# Patient Record
Sex: Female | Born: 1975 | Race: Black or African American | Hispanic: No | Marital: Single | Smoking: Never smoker
Health system: Southern US, Community
[De-identification: ages and names within clinical notes are randomized; demographics above are authoritative.]

## PROBLEM LIST (undated history)

## (undated) HISTORY — PX: CHEST TUBE INSERTION: SHX231

---

## 1999-02-07 ENCOUNTER — Encounter: Payer: Self-pay | Admitting: Emergency Medicine

## 1999-02-07 ENCOUNTER — Emergency Department (HOSPITAL_COMMUNITY): Admission: EM | Admit: 1999-02-07 | Discharge: 1999-02-07 | Payer: Self-pay | Admitting: Emergency Medicine

## 1999-03-27 ENCOUNTER — Emergency Department (HOSPITAL_COMMUNITY): Admission: EM | Admit: 1999-03-27 | Discharge: 1999-03-27 | Payer: Self-pay | Admitting: Emergency Medicine

## 2001-05-25 ENCOUNTER — Ambulatory Visit (HOSPITAL_COMMUNITY): Admission: RE | Admit: 2001-05-25 | Discharge: 2001-05-25 | Payer: Self-pay | Admitting: Internal Medicine

## 2001-06-08 ENCOUNTER — Encounter: Payer: Self-pay | Admitting: Internal Medicine

## 2001-06-08 ENCOUNTER — Ambulatory Visit (HOSPITAL_COMMUNITY): Admission: RE | Admit: 2001-06-08 | Discharge: 2001-06-08 | Payer: Self-pay | Admitting: Internal Medicine

## 2002-02-03 ENCOUNTER — Emergency Department (HOSPITAL_COMMUNITY): Admission: EM | Admit: 2002-02-03 | Discharge: 2002-02-04 | Payer: Self-pay | Admitting: Emergency Medicine

## 2002-05-14 ENCOUNTER — Encounter: Payer: Self-pay | Admitting: Otolaryngology

## 2002-05-14 ENCOUNTER — Encounter: Admission: RE | Admit: 2002-05-14 | Discharge: 2002-05-14 | Payer: Self-pay | Admitting: Otolaryngology

## 2002-06-15 ENCOUNTER — Other Ambulatory Visit: Admission: RE | Admit: 2002-06-15 | Discharge: 2002-06-15 | Payer: Self-pay | Admitting: Otolaryngology

## 2002-07-29 ENCOUNTER — Encounter: Payer: Self-pay | Admitting: Otolaryngology

## 2002-07-29 ENCOUNTER — Encounter: Admission: RE | Admit: 2002-07-29 | Discharge: 2002-07-29 | Payer: Self-pay | Admitting: Otolaryngology

## 2003-03-26 ENCOUNTER — Emergency Department (HOSPITAL_COMMUNITY): Admission: EM | Admit: 2003-03-26 | Discharge: 2003-03-26 | Payer: Self-pay | Admitting: Emergency Medicine

## 2004-03-19 ENCOUNTER — Emergency Department (HOSPITAL_COMMUNITY): Admission: EM | Admit: 2004-03-19 | Discharge: 2004-03-19 | Payer: Self-pay | Admitting: Emergency Medicine

## 2006-12-31 ENCOUNTER — Inpatient Hospital Stay (HOSPITAL_COMMUNITY): Admission: AD | Admit: 2006-12-31 | Discharge: 2006-12-31 | Payer: Self-pay | Admitting: Family Medicine

## 2007-01-02 ENCOUNTER — Inpatient Hospital Stay (HOSPITAL_COMMUNITY): Admission: AD | Admit: 2007-01-02 | Discharge: 2007-01-02 | Payer: Self-pay | Admitting: Obstetrics and Gynecology

## 2007-06-12 ENCOUNTER — Ambulatory Visit (HOSPITAL_COMMUNITY): Admission: RE | Admit: 2007-06-12 | Discharge: 2007-06-12 | Payer: Self-pay | Admitting: Obstetrics and Gynecology

## 2007-06-18 ENCOUNTER — Ambulatory Visit (HOSPITAL_COMMUNITY): Admission: RE | Admit: 2007-06-18 | Discharge: 2007-06-18 | Payer: Self-pay | Admitting: Obstetrics and Gynecology

## 2007-06-23 ENCOUNTER — Encounter (INDEPENDENT_AMBULATORY_CARE_PROVIDER_SITE_OTHER): Payer: Self-pay | Admitting: Obstetrics and Gynecology

## 2007-06-23 ENCOUNTER — Inpatient Hospital Stay (HOSPITAL_COMMUNITY): Admission: AD | Admit: 2007-06-23 | Discharge: 2007-06-25 | Payer: Self-pay | Admitting: Obstetrics and Gynecology

## 2008-04-05 ENCOUNTER — Emergency Department (HOSPITAL_COMMUNITY): Admission: EM | Admit: 2008-04-05 | Discharge: 2008-04-05 | Payer: Self-pay | Admitting: Family Medicine

## 2009-04-14 ENCOUNTER — Emergency Department (HOSPITAL_COMMUNITY): Admission: EM | Admit: 2009-04-14 | Discharge: 2009-04-14 | Payer: Self-pay | Admitting: Emergency Medicine

## 2009-10-15 ENCOUNTER — Emergency Department (HOSPITAL_COMMUNITY): Admission: EM | Admit: 2009-10-15 | Discharge: 2009-10-15 | Payer: Self-pay | Admitting: Emergency Medicine

## 2011-01-24 ENCOUNTER — Emergency Department (HOSPITAL_COMMUNITY): Payer: No Typology Code available for payment source

## 2011-01-24 ENCOUNTER — Emergency Department (HOSPITAL_COMMUNITY)
Admission: EM | Admit: 2011-01-24 | Discharge: 2011-01-24 | Disposition: A | Discharge status: Home / Self Care | Payer: No Typology Code available for payment source | Attending: Emergency Medicine | Admitting: Emergency Medicine

## 2011-01-24 DIAGNOSIS — Y929 Unspecified place or not applicable: Secondary | ICD-10-CM | POA: Insufficient documentation

## 2011-01-24 DIAGNOSIS — M79609 Pain in unspecified limb: Secondary | ICD-10-CM | POA: Insufficient documentation

## 2011-01-24 DIAGNOSIS — M545 Low back pain, unspecified: Secondary | ICD-10-CM | POA: Insufficient documentation

## 2011-01-24 DIAGNOSIS — IMO0001 Reserved for inherently not codable concepts without codable children: Secondary | ICD-10-CM | POA: Insufficient documentation

## 2011-01-24 DIAGNOSIS — M2550 Pain in unspecified joint: Secondary | ICD-10-CM | POA: Insufficient documentation

## 2011-01-24 DIAGNOSIS — S335XXA Sprain of ligaments of lumbar spine, initial encounter: Secondary | ICD-10-CM | POA: Insufficient documentation

## 2011-03-28 LAB — CBC
MCHC: 33.3 g/dL (ref 30.0–36.0)
RBC: 4.59 MIL/uL (ref 3.87–5.11)
RDW: 13.5 % (ref 11.5–15.5)

## 2011-03-28 LAB — POCT I-STAT, CHEM 8
Creatinine, Ser: 0.7 mg/dL (ref 0.4–1.2)
Glucose, Bld: 98 mg/dL (ref 70–99)
Hemoglobin: 14.3 g/dL (ref 12.0–15.0)
TCO2: 25 mmol/L (ref 0–100)

## 2011-03-28 LAB — URINALYSIS, ROUTINE W REFLEX MICROSCOPIC
Nitrite: NEGATIVE
Specific Gravity, Urine: 1.024 (ref 1.005–1.030)
Urobilinogen, UA: 0.2 mg/dL (ref 0.0–1.0)

## 2011-03-28 LAB — URINE MICROSCOPIC-ADD ON

## 2011-03-28 LAB — DIFFERENTIAL
Basophils Absolute: 0 10*3/uL (ref 0.0–0.1)
Basophils Relative: 1 % (ref 0–1)
Monocytes Relative: 15 % — ABNORMAL HIGH (ref 3–12)
Neutro Abs: 1.6 10*3/uL — ABNORMAL LOW (ref 1.7–7.7)
Neutrophils Relative %: 43 % (ref 43–77)

## 2011-05-06 ENCOUNTER — Inpatient Hospital Stay (HOSPITAL_COMMUNITY)
Admission: EM | Admit: 2011-05-06 | Discharge: 2011-05-10 | DRG: 201 | Disposition: A | Discharge status: Home / Self Care | Payer: Medicaid Other | Attending: Thoracic Surgery (Cardiothoracic Vascular Surgery) | Admitting: Thoracic Surgery (Cardiothoracic Vascular Surgery)

## 2011-05-06 ENCOUNTER — Emergency Department (HOSPITAL_COMMUNITY): Payer: Medicaid Other

## 2011-05-06 DIAGNOSIS — J930 Spontaneous tension pneumothorax: Secondary | ICD-10-CM

## 2011-05-06 DIAGNOSIS — J9383 Other pneumothorax: Principal | ICD-10-CM | POA: Diagnosis present

## 2011-05-07 ENCOUNTER — Inpatient Hospital Stay (HOSPITAL_COMMUNITY): Payer: Medicaid Other
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
## 2011-05-07 NOTE — Op Note (Signed)
  NAMEMarland Armstrong  AVIYANA, SONNTAG               ACCOUNT NO.:  [id number]  MEDICAL RECORD NO.:  [id number]           PATIENT TYPE:  I  LOCATION:  2041                         FACILITY:  [facility]  PHYSICIAN:  Salvatore Decent. Cornelius Moras, M.D. DATE OF BIRTH:  January 15, 1976  DATE OF PROCEDURE:  05/06/2011 DATE OF DISCHARGE:                              OPERATIVE REPORT   PREOPERATIVE DIAGNOSIS:  Right spontaneous pneumothorax.  POSTOPERATIVE DIAGNOSIS:  Right spontaneous pneumothorax.  PROCEDURE:  Right chest tube placement.  SURGEON:  Salvatore Decent. Cornelius Moras, MD  ANESTHESIA:  Lidocaine 1% local with intravenous sedation.  PROCEDURE NOTE IN DETAIL:  Following full informed consent with the patient on her stretcher in the emergency department, light intravenous sedation is administered including a total of 2 mg of morphine sulfate and 2 mg of midazolam.  Continuous pulse oximetry and frequent vital signs were monitored with the patient on telemetry in the emergency department.  The patient's right anterior chest was prepared with Betadine solution and draped in a sterile manner.  Lidocaine 1% solution was utilized to anesthetize skin, subcutaneous tissues, and intercostal space overlying the anterior axillary line and approximately the fifth intercostal space.  A small incision is made and further 1% lidocaine was administered directly into the intercostal space.  A 28-French straight chest tube was placed through the incision into the right pleural space and secured to the skin with silk suture.  The procedure was tolerated well.  Repeat chest x-ray pending.     Salvatore Decent. Cornelius Moras, M.D.     CHO/MEDQ  D:  05/06/2011  T:  05/07/2011  Job:  [id number]  Electronically Signed by Tressie Stalker M.D. on 05/07/2011 07:39:13 AM

## 2011-05-07 NOTE — Op Note (Signed)
NAMEMarland Kitchen  Erica, Armstrong               ACCOUNT NO.:  [id number]   MEDICAL RECORD NO.:  [id number]          PATIENT TYPE:  INP   LOCATION:  9114                          FACILITY:  [facility]   PHYSICIAN:  Duke Salvia. Marcelle Overlie, M.D.DATE OF BIRTH:  1976/11/23   DATE OF PROCEDURE:  06/23/2007  DATE OF DISCHARGE:                               OPERATIVE REPORT   DELIVERY NOTE:  This patient was pushing well with a stable FHR with  epidural anesthesia on board.  She was noted to have some significant  bradycardia after each push, the head was crowning straight OA. At that  point, the decision was made to proceed with the VE assisted delivery.  The Kiwi was applied, one traction effort coordinated with maternal  pushing to effect delivery of a female, Apgars 8/9, DeLee plus bulb  suction was carried out, the NICU team was in attendance.  There was no  nuchal cord.  The placenta was delivered spontaneously intact and sent  to pathology.  Pitocin was given IV at that point.   Estimated blood loss was 350.  She had a second degree perineal  laceration, a left periurethral laceration, and a small superficial  right periurethral laceration, all repaired in layered fashion with 3-0  Vicryl Rapide suture.  A pH was sent and is pending.  Mother and baby  doing well at that point.  The baby went to regular nursery for further  observation.      Richard M. Marcelle Overlie, M.D.  Electronically Signed     RMH/MEDQ  D:  06/23/2007  T:  06/23/2007  Job:  [id number]

## 2011-05-08 ENCOUNTER — Inpatient Hospital Stay (HOSPITAL_COMMUNITY): Payer: Medicaid Other

## 2011-05-08 DIAGNOSIS — J930 Spontaneous tension pneumothorax: Secondary | ICD-10-CM

## 2011-05-09 ENCOUNTER — Inpatient Hospital Stay (HOSPITAL_COMMUNITY): Payer: Medicaid Other

## 2011-05-10 ENCOUNTER — Inpatient Hospital Stay (HOSPITAL_COMMUNITY): Payer: Medicaid Other

## 2011-05-10 NOTE — Discharge Summary (Signed)
[facility]  Patient:    Erica Armstrong, Erica Armstrong                        MRN: [id number] Proc. Date: 05/25/01 Adm. Date:  [id number] Attending:  Nathen May CC:         Electrophysiology Laboratory   Referring Physician Discharge [place]  PREOPERATIVE DIAGNOSIS:  Syncope.  POSTOPERATIVE DIAGNOSIS:  Syncope.  PROCEDURE:  Head-up tilt table testing.  DESCRIPTION OF PROCEDURE:  The patient was submitted for tilt table testing. She was equilibrated in the supine position with blood pressures of 100 approximately and heart rates of 60.  She was then tilted upright.  There was a significant increase in her resting heart rate to the mid-80s.  After 18-19 minutes, there was a precipitous decrease in her heart rate from the 80s to the 50s with blood pressures going from 100 to 74, with typical premonitory symptoms and presyncope.  She was returned to the supine position with resolution of her symptoms.  IMPRESSION: 1. Orthostatic hypotension. 2. Neurocardiogenic syncope.  RECOMMENDATIONS: 1. Florinef 100 mcg b.i.d. 2. Increase salt and fluid intake. 3. Follow up with Dr. _____ next week for blood pressure check and    electrolytes. DD:  05/25/01 TD:  05/25/01 Job: [id number]
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
## 2011-05-17 ENCOUNTER — Encounter (INDEPENDENT_AMBULATORY_CARE_PROVIDER_SITE_OTHER): Payer: Medicaid Other

## 2011-05-17 DIAGNOSIS — J930 Spontaneous tension pneumothorax: Secondary | ICD-10-CM

## 2011-05-24 ENCOUNTER — Other Ambulatory Visit: Payer: Self-pay | Admitting: Thoracic Surgery (Cardiothoracic Vascular Surgery)

## 2011-05-24 DIAGNOSIS — J930 Spontaneous tension pneumothorax: Secondary | ICD-10-CM

## 2011-05-27 ENCOUNTER — Ambulatory Visit
Admission: RE | Admit: 2011-05-27 | Discharge: 2011-05-27 | Disposition: A | Discharge status: Home / Self Care | Payer: Medicaid Other | Source: Ambulatory Visit | Attending: Thoracic Surgery (Cardiothoracic Vascular Surgery) | Admitting: Thoracic Surgery (Cardiothoracic Vascular Surgery)

## 2011-05-27 ENCOUNTER — Encounter (INDEPENDENT_AMBULATORY_CARE_PROVIDER_SITE_OTHER): Payer: Medicaid Other | Admitting: Thoracic Surgery (Cardiothoracic Vascular Surgery)

## 2011-05-27 DIAGNOSIS — J930 Spontaneous tension pneumothorax: Secondary | ICD-10-CM

## 2011-05-28 NOTE — Assessment & Plan Note (Signed)
OFFICE VISIT  Erica Armstrong, Erica Armstrong DOB:  10/22/1976                                        May 27, 2011 CHART #:  [id number]  HISTORY OF PRESENT ILLNESS:  The patient returns for routine followup, status post right spontaneous pneumothorax for which she underwent chest tube placement on May 06, 2011.  Her lung re-expanded without complication and her chest tube was removed uneventfully.  Since hospital discharge she has done fine.  She reports only minimal residual soreness in the right side and she has had no shortness of breath.  REVIEW OF SYSTEMS:  The remainder of her review of systems is unremarkable.  PHYSICAL EXAMINATION:  GENERAL:  Well-appearing female.  VITAL SIGNS: Blood pressure 121/69, pulse 72, and oxygen saturation 99% on room air. LUNGS:  Auscultation reveals clear breath sounds that are symmetrical bilaterally.  The old chest tube incision has healed nicely.  DIAGNOSTICS:  Test chest x-ray demonstrates no residual pneumothorax. No other abnormalities are noted.  IMPRESSION:  Resolved right spontaneous pneumothorax.  PLAN:  In the future, the patient will call or return to see [place] as needed.  She has been counseled regarding the 80% likelihood this will never again recur, but she also understands that in the 20% possibility that she does have a recurrent spontaneous pneumothorax on the right side, we would need to consider surgical intervention.  All their questions have been addressed.  At this point I think she has no physical limitations.  Salvatore Decent. Erica Armstrong, M.D. Electronically Signed  CHO/MEDQ  D:  05/27/2011  T:  05/28/2011  Job:  [id number]  cc:   Merlene Laughter. Renae Gloss, M.D.

## 2011-06-17 ENCOUNTER — Emergency Department (HOSPITAL_COMMUNITY)
Admission: EM | Admit: 2011-06-17 | Discharge: 2011-06-18 | Disposition: A | Discharge status: Home / Self Care | Payer: Medicaid Other | Attending: Emergency Medicine | Admitting: Emergency Medicine

## 2011-06-17 DIAGNOSIS — R6883 Chills (without fever): Secondary | ICD-10-CM | POA: Insufficient documentation

## 2011-06-17 DIAGNOSIS — R599 Enlarged lymph nodes, unspecified: Secondary | ICD-10-CM | POA: Insufficient documentation

## 2011-06-17 DIAGNOSIS — J3489 Other specified disorders of nose and nasal sinuses: Secondary | ICD-10-CM | POA: Insufficient documentation

## 2011-06-17 DIAGNOSIS — J029 Acute pharyngitis, unspecified: Secondary | ICD-10-CM | POA: Insufficient documentation

## 2011-06-17 DIAGNOSIS — R0982 Postnasal drip: Secondary | ICD-10-CM | POA: Insufficient documentation

## 2011-06-18 ENCOUNTER — Emergency Department (HOSPITAL_COMMUNITY)
Admission: EM | Admit: 2011-06-18 | Discharge: 2011-06-18 | Disposition: A | Discharge status: Home / Self Care | Payer: Medicaid Other | Attending: Emergency Medicine | Admitting: Emergency Medicine

## 2011-06-18 ENCOUNTER — Emergency Department (HOSPITAL_COMMUNITY): Payer: Medicaid Other

## 2011-06-18 DIAGNOSIS — J36 Peritonsillar abscess: Secondary | ICD-10-CM | POA: Insufficient documentation

## 2011-06-18 DIAGNOSIS — R070 Pain in throat: Secondary | ICD-10-CM | POA: Insufficient documentation

## 2011-06-18 DIAGNOSIS — R220 Localized swelling, mass and lump, head: Secondary | ICD-10-CM | POA: Insufficient documentation

## 2011-06-18 DIAGNOSIS — R1310 Dysphagia, unspecified: Secondary | ICD-10-CM | POA: Insufficient documentation

## 2011-06-18 LAB — BASIC METABOLIC PANEL
Calcium: 8.9 mg/dL (ref 8.4–10.5)
Creatinine, Ser: 0.75 mg/dL (ref 0.50–1.10)
GFR calc non Af Amer: >60 mL/min (ref >60)
Glucose, Bld: 86 mg/dL (ref 70–99)
Sodium: 139 mEq/L (ref 135–145)

## 2011-06-18 LAB — CBC
Hemoglobin: 12 g/dL (ref 12.0–15.0)
MCH: 29.1 pg (ref 26.0–34.0)
MCHC: 34.2 g/dL (ref 30.0–36.0)
MCV: 85 fL (ref 78.0–100.0)
Platelets: 257 10*3/uL (ref 150–400)

## 2011-06-18 LAB — DIFFERENTIAL
Basophils Relative: 0 % (ref 0–1)
Eosinophils Absolute: 0 10*3/uL (ref 0.0–0.7)
Lymphs Abs: 1.4 10*3/uL (ref 0.7–4.0)
Monocytes Absolute: 0.9 10*3/uL (ref 0.1–1.0)
Monocytes Relative: 12 % (ref 3–12)

## 2011-06-18 MED ORDER — IOHEXOL 300 MG/ML  SOLN
100.0000 mL | Freq: Once | INTRAMUSCULAR | Status: AC | PRN
  Administered 2011-06-18: 100 mL via INTRAVENOUS

## 2011-06-19 ENCOUNTER — Emergency Department (HOSPITAL_COMMUNITY)
Admission: EM | Admit: 2011-06-19 | Discharge: 2011-06-19 | Disposition: A | Discharge status: Home / Self Care | Payer: Medicaid Other | Attending: Emergency Medicine | Admitting: Emergency Medicine

## 2011-06-19 DIAGNOSIS — R220 Localized swelling, mass and lump, head: Secondary | ICD-10-CM | POA: Insufficient documentation

## 2011-06-19 DIAGNOSIS — R070 Pain in throat: Secondary | ICD-10-CM | POA: Insufficient documentation

## 2011-06-19 DIAGNOSIS — Z9889 Other specified postprocedural states: Secondary | ICD-10-CM | POA: Insufficient documentation

## 2011-10-08 LAB — CBC
HCT: 33.8 — ABNORMAL LOW
HCT: 39.1
Hemoglobin: 11.5 — ABNORMAL LOW
Hemoglobin: 13.1
MCHC: 34
MCV: 89.5
MCV: 90.4
Platelets: 295
RBC: 3.74 — ABNORMAL LOW
WBC: 15.1 — ABNORMAL HIGH
WBC: 9.8

## 2012-01-11 ENCOUNTER — Emergency Department (HOSPITAL_COMMUNITY): Payer: BC Managed Care – PPO

## 2012-01-11 ENCOUNTER — Emergency Department (HOSPITAL_COMMUNITY)
Admission: EM | Admit: 2012-01-11 | Discharge: 2012-01-11 | Disposition: A | Discharge status: Home / Self Care | Payer: BC Managed Care – PPO | Attending: Emergency Medicine | Admitting: Emergency Medicine

## 2012-01-11 ENCOUNTER — Encounter (HOSPITAL_COMMUNITY): Payer: Self-pay

## 2012-01-11 DIAGNOSIS — M25579 Pain in unspecified ankle and joints of unspecified foot: Secondary | ICD-10-CM | POA: Insufficient documentation

## 2012-01-11 DIAGNOSIS — J9383 Other pneumothorax: Secondary | ICD-10-CM

## 2012-01-11 DIAGNOSIS — M25572 Pain in left ankle and joints of left foot: Secondary | ICD-10-CM

## 2012-01-11 DIAGNOSIS — R0682 Tachypnea, not elsewhere classified: Secondary | ICD-10-CM | POA: Insufficient documentation

## 2012-01-11 DIAGNOSIS — M546 Pain in thoracic spine: Secondary | ICD-10-CM | POA: Insufficient documentation

## 2012-01-11 DIAGNOSIS — M25519 Pain in unspecified shoulder: Secondary | ICD-10-CM | POA: Insufficient documentation

## 2012-01-11 DIAGNOSIS — R0602 Shortness of breath: Secondary | ICD-10-CM | POA: Insufficient documentation

## 2012-01-11 DIAGNOSIS — J45909 Unspecified asthma, uncomplicated: Secondary | ICD-10-CM | POA: Insufficient documentation

## 2012-01-11 DIAGNOSIS — R079 Chest pain, unspecified: Secondary | ICD-10-CM | POA: Insufficient documentation

## 2012-01-11 DIAGNOSIS — Z79899 Other long term (current) drug therapy: Secondary | ICD-10-CM | POA: Insufficient documentation

## 2012-01-11 LAB — CBC
HCT: 35.1 % — ABNORMAL LOW (ref 36.0–46.0)
Hemoglobin: 11.7 g/dL — ABNORMAL LOW (ref 12.0–15.0)
MCH: 28.1 pg (ref 26.0–34.0)
MCHC: 33.3 g/dL (ref 30.0–36.0)
MCV: 84.4 fL (ref 78.0–100.0)
Platelets: 293 10*3/uL (ref 150–400)
RBC: 4.16 MIL/uL (ref 3.87–5.11)
RDW: 13 % (ref 11.5–15.5)
WBC: 4.1 K/uL (ref 4.0–10.5)

## 2012-01-11 LAB — BASIC METABOLIC PANEL
BUN: 10 mg/dL (ref 6–23)
Calcium: 9.4 mg/dL (ref 8.4–10.5)
Chloride: 106 mEq/L (ref 96–112)
Creatinine, Ser: 0.77 mg/dL (ref 0.50–1.10)
GFR calc Af Amer: >90 mL/min (ref >90)
GFR calc non Af Amer: >90 mL/min (ref >90)

## 2012-01-11 LAB — BASIC METABOLIC PANEL WITH GFR
CO2: 27 meq/L (ref 19–32)
Glucose, Bld: 111 mg/dL — ABNORMAL HIGH (ref 70–99)
Potassium: 3.2 meq/L — ABNORMAL LOW (ref 3.5–5.1)
Sodium: 140 meq/L (ref 135–145)

## 2012-01-11 MED ORDER — MORPHINE SULFATE 2 MG/ML IJ SOLN
2.0000 mg | Freq: Once | INTRAMUSCULAR | Status: AC
  Administered 2012-01-11: 2 mg via INTRAVENOUS

## 2012-01-11 MED ORDER — MORPHINE SULFATE 2 MG/ML IJ SOLN
2.0000 mg | Freq: Once | INTRAMUSCULAR | Status: AC
  Administered 2012-01-11: 2 mg via INTRAVENOUS
  Filled 2012-01-11: qty 1

## 2012-01-11 MED ORDER — ONDANSETRON HCL 4 MG/2ML IJ SOLN
INTRAMUSCULAR | Status: AC
  Administered 2012-01-11: 4 mg
  Filled 2012-01-11: qty 2

## 2012-01-11 MED ORDER — OXYCODONE-ACETAMINOPHEN 5-325 MG PO TABS: 1.0000 | ORAL_TABLET | Freq: Four times a day (QID) | ORAL | Status: AC | PRN

## 2012-01-11 MED ORDER — MORPHINE SULFATE 2 MG/ML IJ SOLN
INTRAMUSCULAR | Status: AC
  Filled 2012-01-11: qty 1

## 2012-01-11 NOTE — ED Notes (Signed)
Pt. Has pain to right side, pt. States pain same as previous pain with spontaneous pneumothorax.

## 2012-01-11 NOTE — ED Provider Notes (Signed)
I've evaluated and examined patient.  She is accompanied today by supportive husband and family member.  She states she is feeling better.  Lung examined showed clear to auscultation bilaterally with no wheezes or rhonchi.  Cardiac regular rate and rhythm.  Plans to repeat CXR at 830.  If no changes, will plan to d/c home to [facility] with close f/u with physician.  Pt did take her birth control pill this am.    Lindley Magnus [place], [place] 01/11/12 [phone]

## 2012-01-11 NOTE — ED Provider Notes (Signed)
I've reviewed patient's Xray with patient and family.  It does appear improved.  Pt is resting comfortably.  They will follow up with their physician in [place] on Monday.  If symptoms worsen, they were advised to return to ED.  I examined patient's left ankle due to pain.  Pt denies any trauma.  No pain of toes, tibia/fibula pain.  No swelling or abnormalities noted.  Advised rest and ibuprofen for 7 days and if no improvement advised f/u with pcp for further eval.  Pt voiced understanding.  Lindley Magnus [place], [place] 01/11/12 [phone]

## 2012-01-11 NOTE — ED Notes (Signed)
Pt requested breakfast, pt is npo until after her next chest xray to make sure she doesn't need a chest tube.

## 2012-01-11 NOTE — ED Provider Notes (Signed)
Medical screening examination/treatment/procedure(s) were performed by non-physician practitioner and as supervising physician I was immediately available for consultation/collaboration.   Glynn Octave, MD 01/11/12 [phone]

## 2012-01-11 NOTE — ED Notes (Signed)
Pt. Arrived EMS c/o right sided pain of 6 on 0-10 pain scale, shortness of breath x 1 hour, pt. Has  Noted diminished breath sounds on right side. Pt. C/o tightness and pain in chest and back during inspiration, skin warm, dry and appropriate to race, pt.  AOx 4,

## 2012-01-11 NOTE — ED Provider Notes (Signed)
Medical screening examination/treatment/procedure(s) were performed by non-physician practitioner and as supervising physician I was immediately available for consultation/collaboration.   Lorilee Cafarella, MD 01/11/12 1747 

## 2012-01-11 NOTE — ED Provider Notes (Signed)
History     CSN: [id number]  Arrival date & time 01/11/12  0228   First MD Initiated Contact with Patient 01/11/12 0239      Chief Complaint  Patient presents with  . Shortness of Breath    pt. c/o SOB and right side rib pain since 0130, pt. hx of spontaneous pneumothorax x 2     (Consider location/radiation/quality/duration/timing/severity/associated sxs/prior treatment) HPI Comments: Patient reports earlier this evening while she was sitting on the floor on the computer doing some work, she developed some right-sided chest shoulder and upper back discomfort. The patient has a history of spontaneous pneumothorax x2. The first time she was treated here at [facility], with an 80% pneumothorax requiring a chest tube. She reports in the meantime should move to [place] and had another episode a few weeks ago. She reports at that time it was about 30-40%, she did not have a a chest tube and instead was hospitalized for several days for observation, and her pneumothorax eventually improved. She reports no recent fever or, coughing, sneezing or cold symptoms. She reports the pain is similar to her prior episodes. She reports she has a mild history of asthma but has not had any recent wheezing or coughing. She rarely has to use albuterol breathing treatments. She also has a history of migraines but denies any current headaches. She reports that she does not smoke. She reports the chest pain is not associated with sweats, nausea or vomiting or exertional. EMS reports diminished breath sounds on the right side but no tracheal deviation, and no hypotension. Her saturations for EMS were 100%.   Patient is a 36 y.o. female presenting with shortness of breath. The history is provided by the patient and the EMS personnel.  Shortness of Breath  Associated symptoms include chest pain and shortness of breath. Pertinent negatives include no fever, no rhinorrhea and no cough.    Past Medical History  Diagnosis  Date  . Asthma   . Migraine   . Pneumothorax     Past Surgical History  Procedure Date  . Chest tube insertion     No family history on file.  History  Substance Use Topics  . Smoking status: Never Smoker   . Smokeless tobacco: Not on file  . Alcohol Use: No    OB History    Grav Para Term Preterm Abortions TAB SAB Ect Mult Living                  Review of Systems  Constitutional: Negative for fever and chills.  HENT: Negative for rhinorrhea and sneezing.   Respiratory: Positive for shortness of breath. Negative for cough.   Cardiovascular: Positive for chest pain. Negative for palpitations and leg swelling.  Gastrointestinal: Negative for nausea, vomiting and abdominal pain.    Allergies  Sumatriptan  Home Medications   Current Outpatient Rx  Name Route Sig Dispense Refill  . NORGESTIM-ETH ESTRAD TRIPHASIC 0.18/0.215/0.25 MG-35 MCG PO TABS Oral Take 1 tablet by mouth daily.      BP 104/69  Pulse 63  Temp(Src) 98.6 F (37 C) (Oral)  Resp 20  SpO2 100%  Physical Exam  Nursing note and vitals reviewed. Constitutional: She is oriented to person, place, and time. She appears well-developed and well-nourished. No distress.  HENT:  Head: Normocephalic and atraumatic.  Eyes: Pupils are equal, round, and reactive to light.  Neck: Normal range of motion. Neck supple.  Cardiovascular: Normal rate, regular rhythm and normal heart sounds.  Pulmonary/Chest: No accessory muscle usage. Tachypnea noted. No respiratory distress. She has decreased breath sounds in the right upper field. She has no wheezes. She has no rhonchi. She has no rales.  Abdominal: Soft. She exhibits no distension. There is no tenderness.  Musculoskeletal: She exhibits no edema and no tenderness.  Neurological: She is alert and oriented to person, place, and time.  Skin: She is not diaphoretic.    ED Course  Procedures (including critical care time)  Labs Reviewed  CBC - Abnormal; Notable  for the following:    Hemoglobin 11.7 (*)    HCT 35.1 (*)    All other components within normal limits  BASIC METABOLIC PANEL   Dg Chest Portable 1 View  01/11/2012  *RADIOLOGY REPORT*  Clinical Data: Shortness of breath, chest pain.  PORTABLE CHEST - 1 VIEW  Comparison: 05/27/2011  Findings: There is a moderate sized right pneumothorax, likely 10- 15% in size.  No focal airspace opacities.  No effusions.  Heart is normal size.  IMPRESSION: 10-15% right pneumothorax.  Original Report Authenticated By: Cyndie Chime, M.D.     1. Recurrent spontaneous pneumothorax     O2 sats on nonrebreather oxygen is 100%.  MDM  Patient with reported history of spontaneous pneumothorax. Patient does not seem to relate that the pain occurred very suddenly. However given her history and mildly decreased breath sounds on the right side, portable chest x-ray is ordered. I have grossly looked at the chest x-ray on the portable x-ray machine which did not show any tracheal deviation and no obvious large pneumothorax. At this time I will wait for official evaluation of the chest x-ray by radiologist. The patient is not hypotensive, and in no respiratory distress with normal oxygen saturations at this time. I do not have any fear that this is a cardiac event. The patient did recently travel here from [place] which is only a couple of hours of driving. She has no calf tenderness and negative Homans sign do not suspect a PE. Is to continue to monitor and keep her on oxygen as needed.   I reviewed the portable chest x-ray again on the monitor which shows a small 15-20% pneumothorax superiorly on the right side.   3:38 AM Patient continues to remain comfortable, in no respiratory distress with normal oxygen saturations. Plan is to monitor the patient here for several hours, repeat her chest x-ray and give her pneumothorax is not getting worse, I feel the patient likely can be discharged home and can followup with  her own physician or chest surgeon back in [place].  0700 Signed out to [facility] and patient moved to CDU, holding and Dr. Manus Gunning for repeat CXR and if no sig change in PTX, discharge to home to follow up with PCP.    Gavin Pound. Tyjay Galindo, MD 01/11/12 1607
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
## 2014-03-27 ENCOUNTER — Encounter (HOSPITAL_BASED_OUTPATIENT_CLINIC_OR_DEPARTMENT_OTHER): Payer: Self-pay | Admitting: Emergency Medicine

## 2014-03-27 ENCOUNTER — Emergency Department (HOSPITAL_BASED_OUTPATIENT_CLINIC_OR_DEPARTMENT_OTHER)
Admission: EM | Admit: 2014-03-27 | Discharge: 2014-03-27 | Disposition: A | Discharge status: Home / Self Care | Payer: Medicaid Other | Attending: Emergency Medicine | Admitting: Emergency Medicine

## 2014-03-27 DIAGNOSIS — Z79899 Other long term (current) drug therapy: Secondary | ICD-10-CM | POA: Insufficient documentation

## 2014-03-27 DIAGNOSIS — X12XXXA Contact with other hot fluids, initial encounter: Secondary | ICD-10-CM | POA: Insufficient documentation

## 2014-03-27 DIAGNOSIS — T23129A Burn of first degree of unspecified single finger (nail) except thumb, initial encounter: Secondary | ICD-10-CM | POA: Insufficient documentation

## 2014-03-27 DIAGNOSIS — J45909 Unspecified asthma, uncomplicated: Secondary | ICD-10-CM | POA: Insufficient documentation

## 2014-03-27 DIAGNOSIS — X131XXA Other contact with steam and other hot vapors, initial encounter: Secondary | ICD-10-CM

## 2014-03-27 DIAGNOSIS — Y939 Activity, unspecified: Secondary | ICD-10-CM | POA: Insufficient documentation

## 2014-03-27 DIAGNOSIS — T23001A Burn of unspecified degree of right hand, unspecified site, initial encounter: Secondary | ICD-10-CM

## 2014-03-27 DIAGNOSIS — Z8679 Personal history of other diseases of the circulatory system: Secondary | ICD-10-CM | POA: Insufficient documentation

## 2014-03-27 DIAGNOSIS — Y929 Unspecified place or not applicable: Secondary | ICD-10-CM | POA: Insufficient documentation

## 2014-03-27 MED ORDER — HYDROCODONE-ACETAMINOPHEN 5-325 MG PO TABS
2.0000 | ORAL_TABLET | Freq: Once | ORAL | Status: AC
  Administered 2014-03-27: 2 via ORAL
  Filled 2014-03-27: qty 2

## 2014-03-27 MED ORDER — SILVER SULFADIAZINE 1 % EX CREA: TOPICAL_CREAM | Freq: Once | CUTANEOUS | Status: AC

## 2014-03-27 MED ORDER — SILVER SULFADIAZINE 1 % EX CREA
TOPICAL_CREAM | CUTANEOUS | Status: AC
  Filled 2014-03-27: qty 85

## 2014-03-27 MED ORDER — HYDROCODONE-ACETAMINOPHEN 5-325 MG PO TABS: 2.0000 | ORAL_TABLET | ORAL | Status: AC | PRN
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
## 2014-03-27 NOTE — ED Provider Notes (Signed)
CSN: [id number]     Arrival date & time 03/27/14  1620 History   First MD Initiated Contact with Patient 03/27/14 1906     Chief Complaint  Patient presents with  . Burn     (Consider location/radiation/quality/duration/timing/severity/associated sxs/prior Treatment) Patient is a 38 y.o. female presenting with burn. The history is provided by the patient. No language interpreter was used.  Burn Burn location:  Hand Hand burn location:  L hand and R fingers Burn quality:  Intact blister Time since incident:  3 hours Progression:  Worsening Mechanism of burn:  Hot liquid Relieved by:  Nothing Worsened by:  Nothing tried Ineffective treatments:  None tried Tetanus status:  Up to date   Past Medical History  Diagnosis Date  . Asthma   . Migraine   . Pneumothorax    Past Surgical History  Procedure Laterality Date  . Chest tube insertion     No family history on file. History  Substance Use Topics  . Smoking status: Never Smoker   . Smokeless tobacco: Not on file  . Alcohol Use: No   OB History   Grav Para Term Preterm Abortions TAB SAB Ect Mult Living                 Review of Systems  Skin: Positive for wound.  All other systems reviewed and are negative.      Allergies  Sumatriptan  Home Medications   Current Outpatient Rx  Name  Route  Sig  Dispense  Refill  . Norgestimate-Ethinyl Estradiol Triphasic (TRINESSA, 28,) 0.18/0.215/0.25 MG-35 MCG tablet   Oral   Take 1 tablet by mouth daily.          BP 121/69  Pulse 68  Temp(Src) 98.3 F (36.8 C) (Oral)  Resp 18  Ht 5\' 6"  (1.676 m)  Wt 160 lb (72.576 kg)  BMI 25.84 kg/m2  SpO2 100%  LMP 03/20/2014 Physical Exam  Nursing note and vitals reviewed. Constitutional: She is oriented to person, place, and time. She appears well-developed and well-nourished.  HENT:  Head: Normocephalic.  Musculoskeletal: She exhibits tenderness.  Burns to fingers   Neurological: She is alert and oriented to  person, place, and time.  Skin: There is erythema.  Psychiatric: She has a normal mood and affect.    ED Course  Procedures (including critical care time) Labs Review Labs Reviewed - No data to display Imaging Review No results found.   EKG Interpretation None      MDM   Final diagnoses:  Burn of right hand    Pt advised to recheck with her MD in 2 days    Elson AreasLeslie K Sofia, PA-C 03/27/14 2353

## 2014-03-27 NOTE — Discharge Instructions (Signed)

## 2014-03-28 NOTE — ED Provider Notes (Signed)
Medical screening examination/treatment/procedure(s) were performed by non-physician practitioner and as supervising physician I was immediately available for consultation/collaboration.  Ieesha Abbasi E Tally Mckinnon, MD 03/28/14 1153
